# Patient Record
Sex: Male | Born: 1970 | Race: White | Hispanic: No | Marital: Married | Smoking: Former smoker
Health system: Southern US, Community
[De-identification: ages and names within clinical notes are randomized; demographics above are authoritative.]

## PROBLEM LIST (undated history)

## (undated) DIAGNOSIS — Z789 Other specified health status: Secondary | ICD-10-CM

## (undated) HISTORY — DX: Other specified health status: Z78.9

---

## 2006-11-08 ENCOUNTER — Ambulatory Visit: Payer: Self-pay | Admitting: Family Medicine

## 2007-09-15 ENCOUNTER — Emergency Department: Payer: Self-pay | Admitting: Emergency Medicine

## 2008-08-13 ENCOUNTER — Emergency Department: Payer: Self-pay | Admitting: Emergency Medicine

## 2008-08-21 ENCOUNTER — Emergency Department: Payer: Self-pay | Admitting: Emergency Medicine

## 2013-12-23 ENCOUNTER — Emergency Department: Payer: Self-pay | Admitting: Emergency Medicine

## 2013-12-23 LAB — CBC
HCT: 44.9 % (ref 40.0–52.0)
HGB: 15.9 g/dL (ref 13.0–18.0)
MCH: 30.3 pg (ref 26.0–34.0)
MCHC: 35.4 g/dL (ref 32.0–36.0)
MCV: 86 fL (ref 80–100)
PLATELETS: 197 10*3/uL (ref 150–440)
RBC: 5.25 10*6/uL (ref 4.40–5.90)
RDW: 13.3 % (ref 11.5–14.5)
WBC: 6.1 10*3/uL (ref 3.8–10.6)

## 2013-12-23 LAB — BASIC METABOLIC PANEL
Anion Gap: 5 — ABNORMAL LOW (ref 7–16)
BUN: 12 mg/dL (ref 7–18)
CALCIUM: 9.3 mg/dL (ref 8.5–10.1)
Chloride: 107 mmol/L (ref 98–107)
Co2: 26 mmol/L (ref 21–32)
Creatinine: 1.04 mg/dL (ref 0.60–1.30)
EGFR (Non-African Amer.): >60
Glucose: 88 mg/dL (ref 65–99)
OSMOLALITY: 275 (ref 275–301)
Potassium: 3.8 mmol/L (ref 3.5–5.1)
SODIUM: 138 mmol/L (ref 136–145)

## 2013-12-23 LAB — TROPONIN I: Troponin-I: <0.02 ng/mL

## 2014-01-25 ENCOUNTER — Encounter: Payer: Self-pay | Admitting: Neurology

## 2014-01-29 ENCOUNTER — Ambulatory Visit: Payer: Self-pay | Admitting: Neurology

## 2014-02-19 ENCOUNTER — Encounter: Payer: Self-pay | Admitting: Neurology

## 2014-03-16 ENCOUNTER — Ambulatory Visit: Payer: Self-pay | Admitting: Neurology

## 2014-03-22 ENCOUNTER — Encounter: Payer: Self-pay | Admitting: Neurology

## 2015-11-12 IMAGING — CR DG CHEST 2V
1 series · 2 of 2 positions shown · non-contrast
Comparison: None.

CLINICAL DATA: Left shoulder pain, left  arm pain

EXAM:
CHEST  2 VIEW

[Series 1: pa · 0.17mm/px · 2 of 2 slices shown]
[im 1/2]
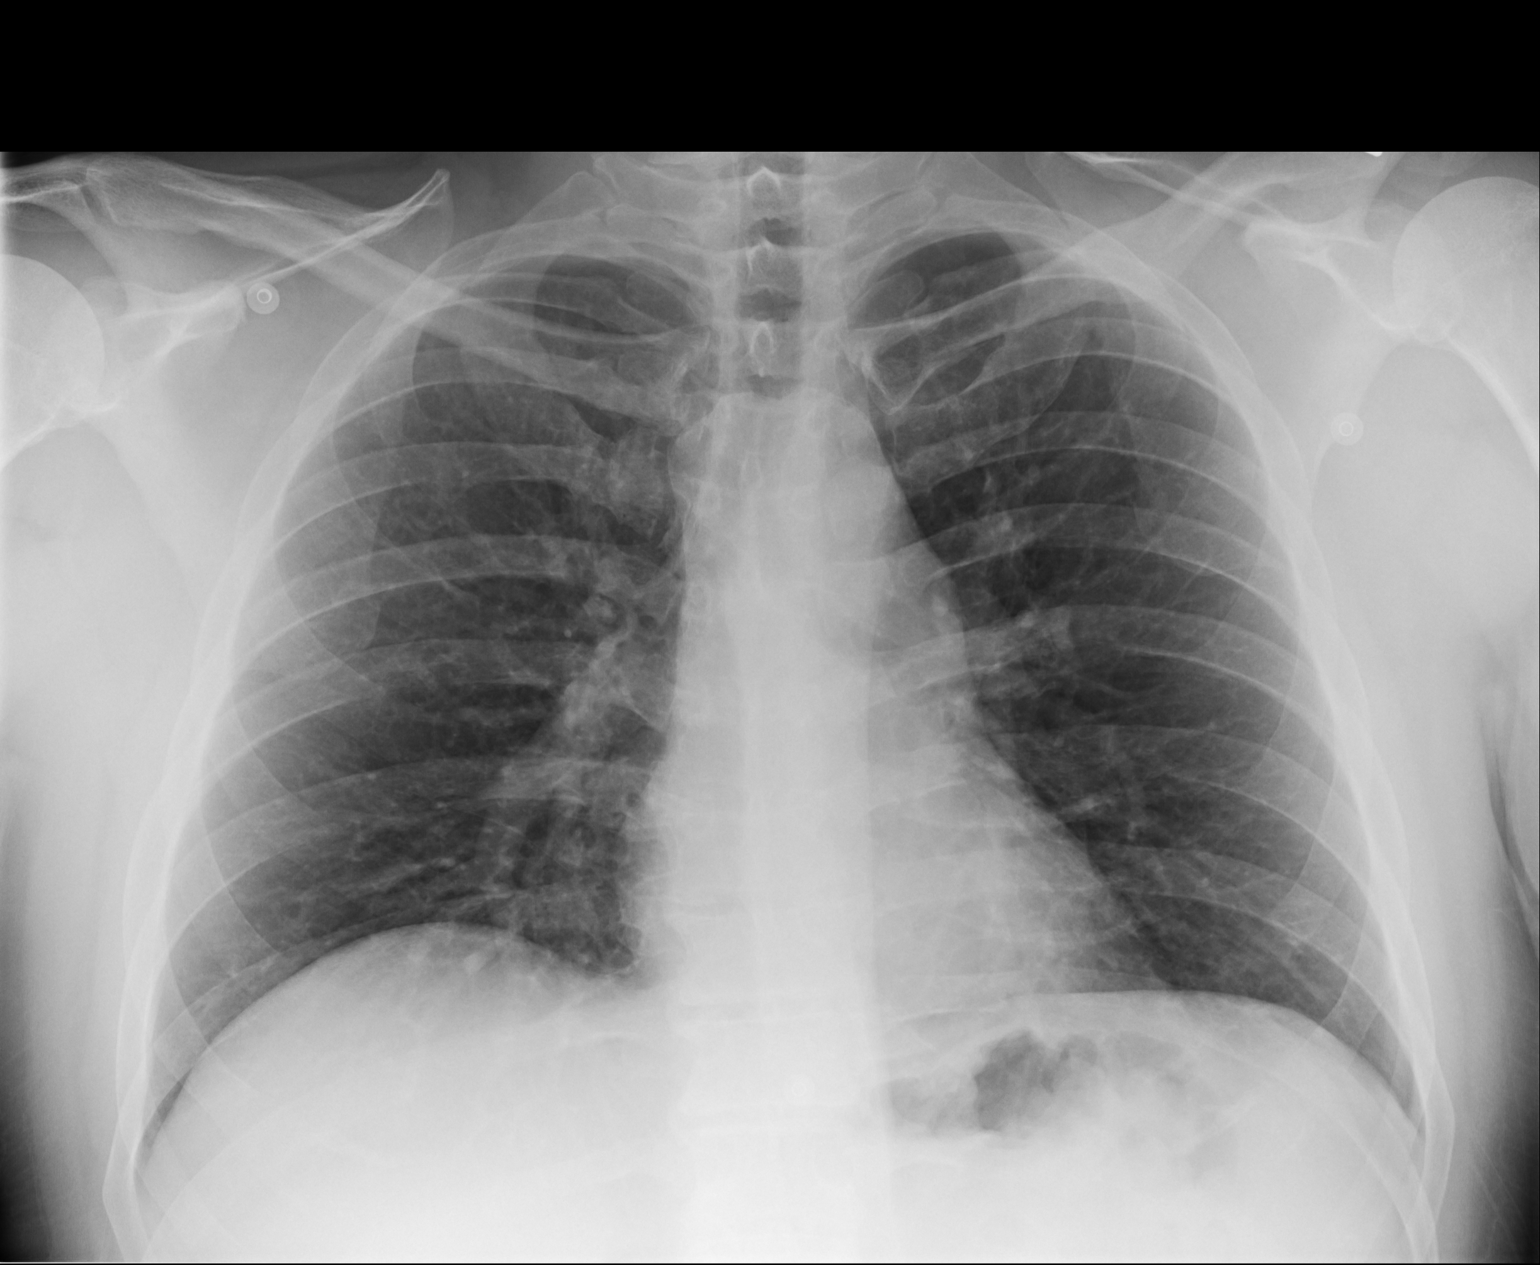
[im 2/2]
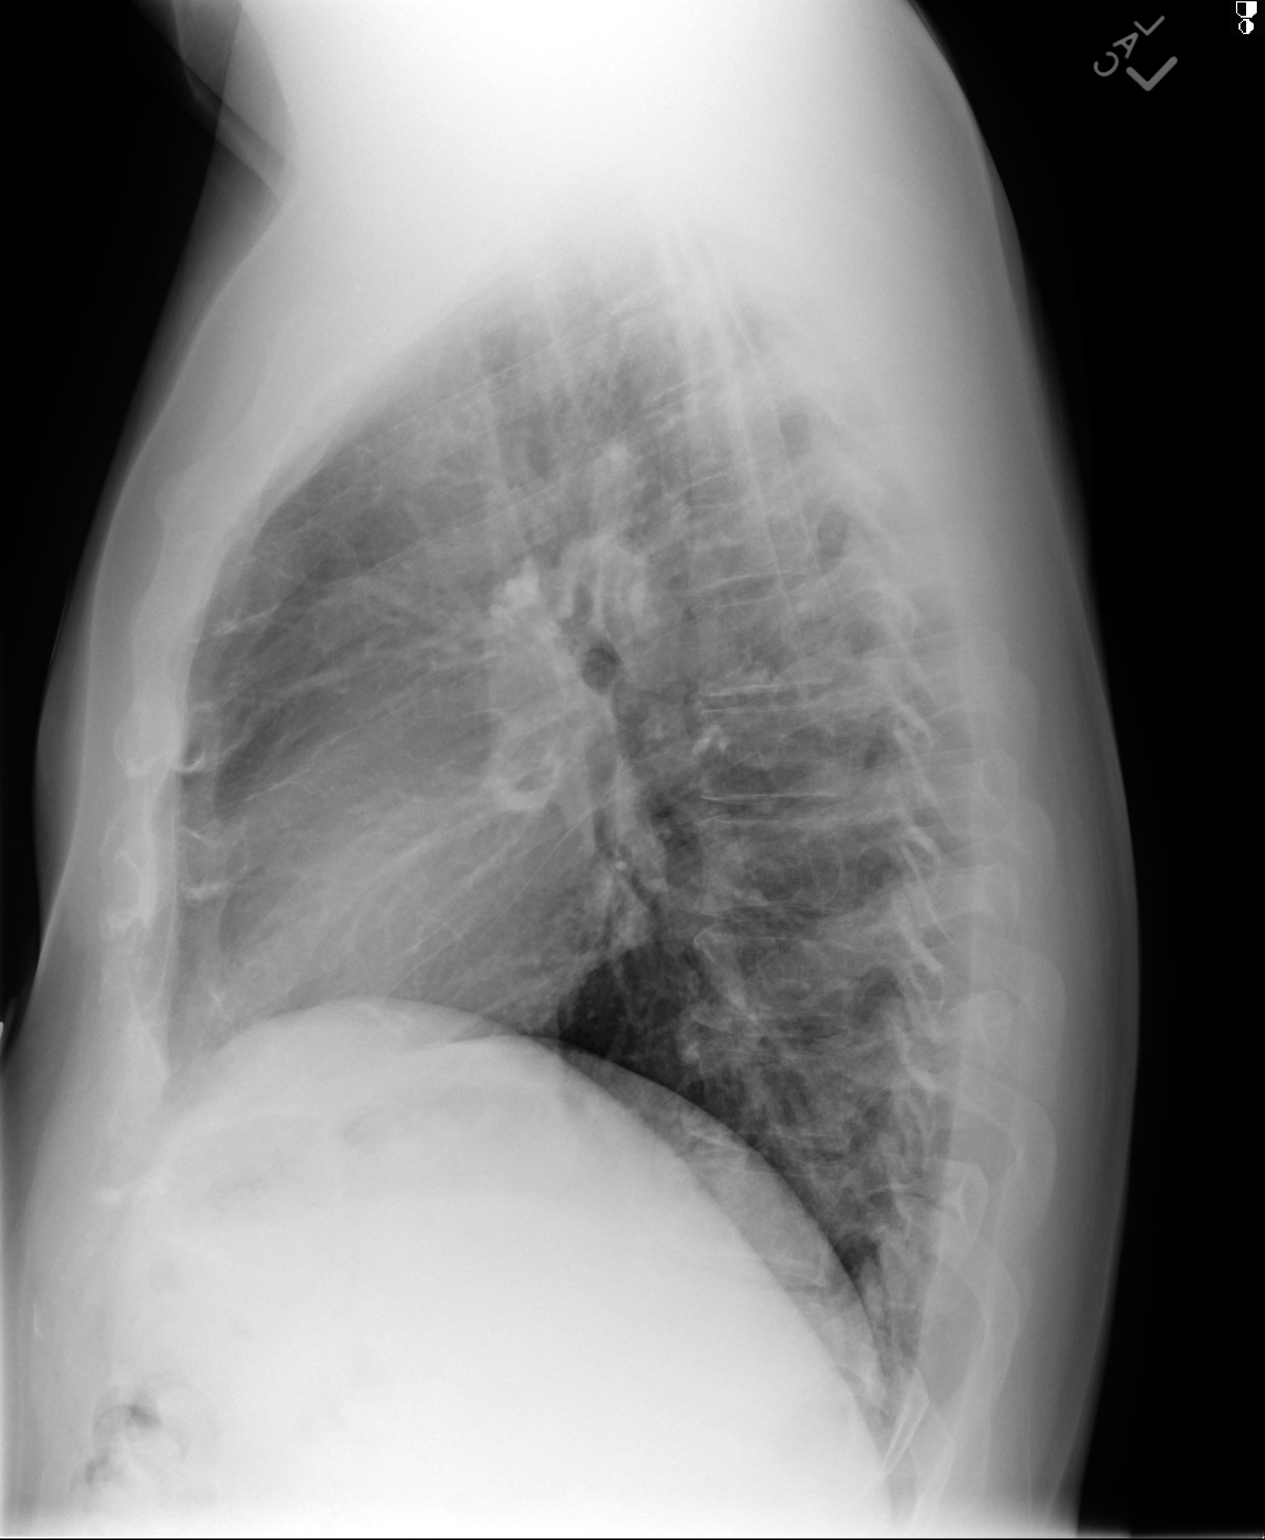

[2 of 2 positions shown; findings below may reference images not displayed]

FINDINGS: Cardiomediastinal silhouette is unremarkable. No acute infiltrate or
pleural effusion. No pulmonary edema. Bony structures are
unremarkable.
IMPRESSION: No active cardiopulmonary disease.

## 2016-09-24 ENCOUNTER — Ambulatory Visit: Payer: 59 | Admitting: Urology

## 2016-10-26 ENCOUNTER — Encounter: Payer: Self-pay | Admitting: Urology

## 2016-10-26 ENCOUNTER — Ambulatory Visit: Payer: 59 | Admitting: Urology

## 2016-10-26 VITALS — BP 111/80 | HR 90 | Ht 74.0 in | Wt 258.9 lb

## 2016-10-26 DIAGNOSIS — N529 Male erectile dysfunction, unspecified: Secondary | ICD-10-CM | POA: Diagnosis not present

## 2016-10-26 MED ORDER — SILDENAFIL CITRATE 20 MG PO TABS: 20.0000 mg | ORAL_TABLET | Freq: Every day | ORAL | 3 refills | Status: DC | PRN

## 2016-10-26 NOTE — Progress Notes (Signed)
10/26/2016 3:15 PM   Jeremy Hogan Jan 29, 1971 [id number]  Referring provider: Lavera Guise, MD [address] [place], [place] [address]  Chief Complaint  Patient presents with  . New Patient (Initial Visit)    low libido    HPI:  Pt has had trouble getting and maintaining an erection for three years. He tried Viagra and Cialis. Truthfully, he recalls issues with ED all the way back to his 20's. Married for 20 yrs. They have two kids age 46 yo and 2 yo. His wife had early menopause and sex can be less spontaneous. He has some AM erections. He denies rec Drugs and supplements. He took AndroGel about 7 yrs ago for a short time for weight gain. He didn't recall it helped his erections. He has a PA that does yearly cholesterol and blood pressure. His T was checked and he was told it was normal.    PMH: No past medical history on file.  Surgical History: No past surgical history on file.  Home Medications:  Allergies as of 10/26/2016      Reactions   Penicillin V Potassium    Other reaction(s): Unknown As a child      Medication List    as of 10/26/2016  3:15 PM   You have not been prescribed any medications.     Allergies:  Allergies  Allergen Reactions  . Penicillin V Potassium     Other reaction(s): Unknown As a child    Family History: Family History  Problem Relation Age of Onset  . Prostate cancer Neg Hx     Social History:  reports that he quit smoking about 20 years ago. He has never used smokeless tobacco. He reports that he drinks about 1.2 oz of alcohol per week . He reports that he does not use drugs.  ROS: UROLOGY Frequent Urination?: No Hard to postpone urination?: No Burning/pain with urination?: No Get up at night to urinate?: No Leakage of urine?: No Urine stream starts and stops?: No Trouble starting stream?: No Do you have to strain to urinate?: No Blood in urine?: No Urinary tract infection?: No Sexually transmitted disease?:  No Injury to kidneys or bladder?: No Painful intercourse?: No Weak stream?: No Erection problems?: Yes Penile pain?: No  Gastrointestinal Nausea?: No Vomiting?: No Indigestion/heartburn?: No Diarrhea?: No Constipation?: No  Constitutional Fever: No Night sweats?: No Weight loss?: No Fatigue?: No  Skin Skin rash/lesions?: No Itching?: No  Eyes Blurred vision?: No Double vision?: No  Ears/Nose/Throat Sore throat?: No Sinus problems?: No  Hematologic/Lymphatic Swollen glands?: No Easy bruising?: No  Cardiovascular Leg swelling?: No Chest pain?: No  Respiratory Cough?: No Shortness of breath?: No  Endocrine Excessive thirst?: No  Musculoskeletal Back pain?: No Joint pain?: No  Neurological Headaches?: No Dizziness?: No  Psychologic Depression?: No Anxiety?: No  Physical Exam: BP 111/80   Pulse 90   Ht 6\' 2"  (1.88 m)   Wt 117.4 kg (258 lb 14.4 oz)   BMI 33.24 kg/m   Constitutional:  Alert and oriented, No acute distress. HEENT: [place] AT, moist mucus membranes.  Trachea midline, no masses. Cardiovascular: No clubbing, cyanosis, or edema. Respiratory: Normal respiratory effort, no increased work of breathing. GI: Abdomen is soft, nontender, nondistended, no abdominal masses GU: No CVA tenderness. Skin: No rashes, bruises or suspicious lesions. Lymph: No cervical or inguinal adenopathy. Neurologic: Grossly intact, no focal deficits, moving all 4 extremities. Psychiatric: Normal mood and affect. GU: circumcised penis - no lesions. Testicles  descended bilaterally and palpably normal. On digital rectal exam the prostate is about 20 g and benign. The seminal vesicles were not palpable. Laboratory Data: Lab Results  Component Value Date   WBC 6.1 12/23/2013   HGB 15.9 12/23/2013   HCT 44.9 12/23/2013   MCV 86 12/23/2013   PLT 197 12/23/2013    Lab Results  Component Value Date   CREATININE 1.04 12/23/2013    No results found for: PSA  No  results found for: TESTOSTERONE  No results found for: HGBA1C  Urinalysis No results found for: COLORURINE, APPEARANCEUR, LABSPEC, PHURINE, GLUCOSEU, HGBUR, BILIRUBINUR, KETONESUR, PROTEINUR, UROBILINOGEN, NITRITE, LEUKOCYTESUR    Assessment & Plan:   1) ED - We discussed etiology of erectile dysfunction both organic and psychologic. It sounds like he has appropriate primary care evaluation for blood pressure cholesterol and testosterone evaluation. We discussed the nature risk and benefits of ED 5 inhibitors, Muse and injections among other treatments. We also discussed the off label use of generic sildenafil and I sent a prescription for him. Another available treatment is sex therapy if he's truly had issues since his 63s. He'll consider. Also, he'll follow-up on his testosterone level. We discussed normal values.  There are no diagnoses linked to this encounter.  No Follow-up on file.  Festus Aloe, [facility] [address] [place], [place] [address] [phone]

## 2017-03-13 ENCOUNTER — Encounter: Payer: Self-pay | Admitting: General Surgery

## 2017-04-01 ENCOUNTER — Ambulatory Visit: Payer: Self-pay | Admitting: General Surgery

## 2017-04-09 ENCOUNTER — Encounter: Payer: Self-pay | Admitting: General Surgery

## 2017-04-09 ENCOUNTER — Ambulatory Visit (INDEPENDENT_AMBULATORY_CARE_PROVIDER_SITE_OTHER): Payer: 59 | Admitting: General Surgery

## 2017-04-09 VITALS — BP 124/84 | HR 80 | Resp 12 | Ht 73.0 in | Wt 269.0 lb

## 2017-04-09 DIAGNOSIS — L723 Sebaceous cyst: Secondary | ICD-10-CM | POA: Diagnosis not present

## 2017-04-09 NOTE — Patient Instructions (Addendum)
The patient is aware to call back for any questions or concerns. Return in 1 week for suture removal Apply antibiotic ointment to the area daily  Keep area clean may wash hair starting tomorrow

## 2017-04-09 NOTE — Progress Notes (Signed)
Patient ID: Jeremy Hogan, male   DOB: 19-Nov-1970, 46 y.o.   MRN: [id number]  Chief Complaint  Patient presents with  . Mass    HPI Jeremy Hogan is a 46 y.o. male.  Here today for evaluation of a mass on top of his head referred by Evelena Peat NP. He states it has been drained before about 5 years ago. No pain only soreness but it has gotten larger.  HPI  Past Medical History:  Diagnosis Date  . Patient denies medical problems     No past surgical history on file.  Family History  Problem Relation Age of Onset  . Prostate cancer Neg Hx     Social History Social History  Substance Use Topics  . Smoking status: Former Smoker    Quit date: 10/26/1996  . Smokeless tobacco: Never Used  . Alcohol use 1.2 oz/week    2 Cans of beer per week     Comment: daily     Allergies  Allergen Reactions  . Penicillin V Potassium     Other reaction(s): Unknown As a child    Current Outpatient Prescriptions  Medication Sig Dispense Refill  . Calcium Carbonate Antacid (ALKA-SELTZER ANTACID PO) Take by mouth as needed.    Marland Kitchen ibuprofen (ADVIL,MOTRIN) 200 MG tablet Take 200 mg by mouth every 6 (six) hours as needed.     No current facility-administered medications for this visit.     Review of Systems Review of Systems  Blood pressure 124/84, pulse 80, resp. rate 12, height 6\' 1"  (1.854 m), weight 269 lb (122 kg).  Physical Exam Physical Exam  Constitutional: He is oriented to person, place, and time. He appears well-developed and well-nourished.  HENT:  Head:    sebaceous cyst scalp  Neurological: He is alert and oriented to person, place, and time.  Skin: Skin is warm and dry.  Psychiatric: His behavior is normal.    Data Reviewed The patient was amenable to excision. 10 mL of 0.5% Xylocaine with 0.25% Marcaine with 1-200,000 units was instilled after cleansing of the skin with ChloraPrep. A small area of hair was removed to provide a clear surgical  field.  Through elliptical incision the area was excised with an intact cyst. The deep tissue was approximated and hemostasis achieved with a 3-0 Vicryl suture ligature. The skin was closed with interrupted 4-0 Prolene sutures. Bacitracin ointment and a small piece of Telfa applied.  Assessment    Enlarging sebaceous cyst, symptomatic.    Plan         Return in 1 week for suture removal Apply antibiotic ointment to the area daily May wash hair starting tomorrow  HPI, Physical Exam, Assessment and Plan have been scribed under the direction and in the presence of Robert Bellow, MD.  Karie Fetch, RN  I have completed the exam and reviewed the above documentation for accuracy and completeness.  I agree with the above.  Haematologist has been used and any errors in dictation or transcription are unintentional.  Hervey Ard, M.D., F.A.C.S.  Robert Bellow 04/09/2017, 8:23 PM

## 2017-04-12 ENCOUNTER — Telehealth: Payer: Self-pay

## 2017-04-12 NOTE — Telephone Encounter (Signed)
-----   Message from Robert Bellow, MD sent at 04/12/2017  7:11 AM EDT ----- Please notify the patient that the cyst removed from his scalp was fine. ----- Message ----- From: Interface, [facility] Sent: 04/11/2017   5:06 PM To: Robert Bellow, MD

## 2017-04-12 NOTE — Telephone Encounter (Signed)
Notified patient as instructed, patient pleased. Discussed follow-up appointments, patient agrees  

## 2017-04-16 ENCOUNTER — Ambulatory Visit (INDEPENDENT_AMBULATORY_CARE_PROVIDER_SITE_OTHER): Payer: 59 | Admitting: *Deleted

## 2017-04-16 DIAGNOSIS — L723 Sebaceous cyst: Secondary | ICD-10-CM

## 2017-04-16 NOTE — Progress Notes (Signed)
Patient came in today for a wound check.  The wound is clean, with no signs of infection noted.The sutures were removed.

## 2017-04-23 ENCOUNTER — Ambulatory Visit: Payer: 59

## 2017-05-22 ENCOUNTER — Ambulatory Visit: Payer: 59 | Admitting: Urology

## 2017-05-22 ENCOUNTER — Encounter: Payer: Self-pay | Admitting: Urology

## 2017-05-22 VITALS — BP 123/81 | HR 89 | Ht 73.0 in | Wt 266.5 lb

## 2017-05-22 DIAGNOSIS — N5201 Erectile dysfunction due to arterial insufficiency: Secondary | ICD-10-CM

## 2017-05-22 NOTE — Progress Notes (Signed)
05/22/2017 9:25 AM   Jeremy Hogan [birth year] [id number]  Referring provider: Christie Nottingham, [place], [place] [address]  Chief Complaint  Patient presents with  . Erectile Dysfunction    HPI:  F/u ED - Pt has had trouble getting and maintaining an erection since about Jan 2015. He tried Viagra and Cialis. On further assessment, he recalls issues with ED all the way back to his 20's. Married for 20 + yrs. They have two kids age 46 yo and 38 yo in 4. His wife had early menopause and sex can be less spontaneous. He has some AM erections. He denies rec Drugs and supplements. He took AndroGel about 7 yrs ago for a short time for weight gain. He didn't recall it helped his erections. He has a PA that does yearly cholesterol and blood pressure. His T was checked and he was told it was normal Jan 2018. He started sildenafil Jan 2018.   He's using sildenafil 100 mg with good result. We don't have any T levels. He feels like his libido is low. He's gained some weight. He has fatigue but a lot stress at work. He's had a vasectomy.    PMH: Past Medical History:  Diagnosis Date  . Patient denies medical problems     Surgical History: History reviewed. No pertinent surgical history.  Home Medications:  Allergies as of 05/22/2017      Reactions   Penicillin V Potassium    Other reaction(s): Unknown As a child      Medication List       Accurate as of 05/22/17  9:25 AM. Always use your most recent med list.          ALKA-SELTZER ANTACID PO Take by mouth as needed.   ibuprofen 200 MG tablet Commonly known as:  ADVIL,MOTRIN Take 200 mg by mouth every 6 (six) hours as needed.   sildenafil 20 MG tablet Commonly known as:  REVATIO TAKE 1 TABLET (20 MG TOTAL) BY MOUTH DAILY AS NEEDED (TAKE 1-5 TABS BY MOUTH AS NEEDED).       Allergies:  Allergies  Allergen Reactions  . Penicillin V Potassium     Other reaction(s): Unknown As a child    Family  History: Family History  Problem Relation Age of Onset  . Prostate cancer Neg Hx     Social History:  reports that he quit smoking about 20 years ago. He has never used smokeless tobacco. He reports that he drinks about 1.2 oz of alcohol per week . He reports that he does not use drugs.  ROS: UROLOGY Frequent Urination?: No Hard to postpone urination?: No Burning/pain with urination?: No Get up at night to urinate?: No Leakage of urine?: No Urine stream starts and stops?: No Trouble starting stream?: No Do you have to strain to urinate?: No Blood in urine?: No Urinary tract infection?: No Sexually transmitted disease?: No Injury to kidneys or bladder?: No Painful intercourse?: No Weak stream?: No Erection problems?: No Penile pain?: No  Gastrointestinal Nausea?: No Vomiting?: No Indigestion/heartburn?: No Diarrhea?: No Constipation?: No  Constitutional Fever: No Night sweats?: No Weight loss?: No Fatigue?: No  Skin Skin rash/lesions?: No Itching?: No  Eyes Blurred vision?: No Double vision?: No  Ears/Nose/Throat Sore throat?: No Sinus problems?: No  Hematologic/Lymphatic Swollen glands?: No Easy bruising?: No  Cardiovascular Leg swelling?: No Chest pain?: No  Respiratory Cough?: No Shortness of breath?: No  Endocrine Excessive thirst?: No  Musculoskeletal Back pain?:  No Joint pain?: No  Neurological Headaches?: No Dizziness?: No  Psychologic Depression?: No Anxiety?: No  Physical Exam: BP 123/81 (BP Location: Left Arm, Patient Position: Sitting, Cuff Size: Normal)   Pulse 89   Ht 6\' 1"  (1.854 m)   Wt 120.9 kg (266 lb 8 oz)   BMI 35.16 kg/m   Constitutional:  Alert and oriented, No acute distress. HEENT: [place] AT, moist mucus membranes.  Trachea midline, no masses. Cardiovascular: No clubbing, cyanosis, or edema. Respiratory: Normal respiratory effort, no increased work of breathing. GI: Abdomen is soft, nontender, nondistended, no  abdominal masses Skin: No rashes, bruises or suspicious lesions. Neurologic: Grossly intact, no focal deficits, moving all 4 extremities. Psychiatric: Normal mood and affect.  Laboratory Data: Lab Results  Component Value Date   WBC 6.1 12/23/2013   HGB 15.9 12/23/2013   HCT 44.9 12/23/2013   MCV 86 12/23/2013   PLT 197 12/23/2013    Lab Results  Component Value Date   CREATININE 1.04 12/23/2013    No results found for: PSA  No results found for: TESTOSTERONE  No results found for: HGBA1C  Urinalysis No results found for: COLORURINE, APPEARANCEUR, LABSPEC, PHURINE, GLUCOSEU, HGBUR, BILIRUBINUR, KETONESUR, PROTEINUR, UROBILINOGEN, NITRITE, LEUKOCYTESUR   Assessment & Plan:   1) ED - stable. Will check T level this AM and endocrine eval if needed. He has several endocrine complaints and might do well with something like clomiphene depending on his labs. We discussed the nature r/b of clomiphene vs T replacement. He said when he used AndroGel he had to be "vary careful" with it.   There are no diagnoses linked to this encounter.  No Follow-up on file.  Festus Aloe, [facility] [address], [place], [place] [address] [phone]

## 2017-05-23 ENCOUNTER — Telehealth: Payer: Self-pay

## 2017-05-23 DIAGNOSIS — E291 Testicular hypofunction: Secondary | ICD-10-CM

## 2017-05-23 LAB — TESTOSTERONE: TESTOSTERONE: 346 ng/dL (ref 264–916)

## 2017-05-23 NOTE — Telephone Encounter (Signed)
Patient notified and labs ordered, patient will come in for blood draw tomorrow

## 2017-05-23 NOTE — Telephone Encounter (Signed)
-----   Message from Festus Aloe, MD sent at 05/23/2017 10:54 AM EDT ----- Notify patient his T is slightly low at 346 (normal 350 and above). If he wants to investigate further, he should return for labs one morning before 9 AM for: -hematocrit -LH -FSH -prolactin -testosterone    ----- Message ----- From: Royanne Foots, CMA Sent: 05/23/2017   9:37 AM To: Festus Aloe, MD    ----- Message ----- From: Interface, [facility] Lab Results In Sent: 05/23/2017   5:42 AM To: Rowe Robert Clinical

## 2017-05-24 ENCOUNTER — Other Ambulatory Visit: Payer: 59

## 2017-05-24 DIAGNOSIS — E291 Testicular hypofunction: Secondary | ICD-10-CM

## 2017-05-25 LAB — FSH/LH
FSH: 3.6 m[IU]/mL (ref 1.5–12.4)
LH: 6.6 m[IU]/mL (ref 1.7–8.6)

## 2017-05-25 LAB — PROLACTIN: PROLACTIN: 8.8 ng/mL (ref 4.0–15.2)

## 2017-05-25 LAB — HEMATOCRIT: HEMATOCRIT: 46.5 % (ref 37.5–51.0)

## 2017-05-25 LAB — TESTOSTERONE: Testosterone: 419 ng/dL (ref 264–916)

## 2017-05-30 ENCOUNTER — Telehealth: Payer: Self-pay | Admitting: Family Medicine

## 2017-05-30 NOTE — Telephone Encounter (Signed)
-----   Message from Festus Aloe, MD sent at 05/30/2017  7:59 AM EDT ----- Notify patient his endocrine labs are normal -- give results. I do not recommend testosterone replacement or manipulation currently, but we can monitor his T levels over time.    ----- Message ----- From: Royanne Foots, CMA Sent: 05/27/2017   9:56 AM To: Festus Aloe, MD    ----- Message ----- From: Lavone Neri Lab Results In Sent: 05/25/2017   5:42 AM To: Royanne Foots, CMA

## 2017-05-30 NOTE — Telephone Encounter (Signed)
Patient notified and voiced understanding.

## 2017-09-26 ENCOUNTER — Other Ambulatory Visit: Payer: Self-pay | Admitting: Nurse Practitioner

## 2017-09-26 DIAGNOSIS — G3184 Mild cognitive impairment, so stated: Secondary | ICD-10-CM

## 2017-10-02 ENCOUNTER — Ambulatory Visit: Payer: Self-pay

## 2017-10-18 ENCOUNTER — Ambulatory Visit: Payer: Self-pay | Admitting: Nurse Practitioner

## 2017-10-18 DIAGNOSIS — R42 Dizziness and giddiness: Secondary | ICD-10-CM | POA: Insufficient documentation

## 2017-10-18 DIAGNOSIS — R05 Cough: Secondary | ICD-10-CM | POA: Insufficient documentation

## 2017-10-18 DIAGNOSIS — M7661 Achilles tendinitis, right leg: Secondary | ICD-10-CM | POA: Insufficient documentation

## 2017-10-18 DIAGNOSIS — H6693 Otitis media, unspecified, bilateral: Secondary | ICD-10-CM | POA: Insufficient documentation

## 2017-10-18 DIAGNOSIS — R5383 Other fatigue: Secondary | ICD-10-CM | POA: Insufficient documentation

## 2017-10-18 DIAGNOSIS — M542 Cervicalgia: Secondary | ICD-10-CM | POA: Insufficient documentation

## 2017-10-18 DIAGNOSIS — J060 Acute laryngopharyngitis: Secondary | ICD-10-CM | POA: Insufficient documentation

## 2017-10-18 DIAGNOSIS — R822 Biliuria: Secondary | ICD-10-CM | POA: Insufficient documentation

## 2017-10-18 DIAGNOSIS — J0191 Acute recurrent sinusitis, unspecified: Secondary | ICD-10-CM | POA: Insufficient documentation

## 2017-10-18 DIAGNOSIS — R6882 Decreased libido: Secondary | ICD-10-CM | POA: Insufficient documentation

## 2017-10-18 DIAGNOSIS — M67431 Ganglion, right wrist: Secondary | ICD-10-CM | POA: Insufficient documentation

## 2017-10-18 DIAGNOSIS — G3184 Mild cognitive impairment, so stated: Secondary | ICD-10-CM | POA: Insufficient documentation

## 2017-10-18 DIAGNOSIS — R079 Chest pain, unspecified: Secondary | ICD-10-CM | POA: Insufficient documentation

## 2017-10-18 DIAGNOSIS — E299 Testicular dysfunction, unspecified: Secondary | ICD-10-CM | POA: Insufficient documentation

## 2017-10-18 DIAGNOSIS — R059 Cough, unspecified: Secondary | ICD-10-CM | POA: Insufficient documentation

## 2017-10-18 DIAGNOSIS — D485 Neoplasm of uncertain behavior of skin: Secondary | ICD-10-CM | POA: Insufficient documentation

## 2017-10-18 DIAGNOSIS — E782 Mixed hyperlipidemia: Secondary | ICD-10-CM | POA: Insufficient documentation

## 2017-11-25 ENCOUNTER — Ambulatory Visit: Payer: 59
